# Patient Record
Sex: Male | Born: 1935 | Race: White | Hispanic: No | Marital: Married | Smoking: Former smoker
Health system: Southern US, Community
[De-identification: ages and names within clinical notes are randomized; demographics above are authoritative.]

## PROBLEM LIST (undated history)

## (undated) DIAGNOSIS — I10 Essential (primary) hypertension: Secondary | ICD-10-CM

## (undated) DIAGNOSIS — Z950 Presence of cardiac pacemaker: Secondary | ICD-10-CM

## (undated) DIAGNOSIS — E785 Hyperlipidemia, unspecified: Secondary | ICD-10-CM

## (undated) DIAGNOSIS — I2510 Atherosclerotic heart disease of native coronary artery without angina pectoris: Secondary | ICD-10-CM

## (undated) DIAGNOSIS — F0390 Unspecified dementia without behavioral disturbance: Secondary | ICD-10-CM

## (undated) DIAGNOSIS — I2109 ST elevation (STEMI) myocardial infarction involving other coronary artery of anterior wall: Secondary | ICD-10-CM

## (undated) HISTORY — PX: INSERT / REPLACE / REMOVE PACEMAKER: SUR710

## (undated) HISTORY — PX: CARDIAC CATHETERIZATION: SHX172

---

## 2016-05-20 ENCOUNTER — Emergency Department (HOSPITAL_COMMUNITY)
Admission: EM | Admit: 2016-05-20 | Discharge: 2016-05-20 | Disposition: A | Discharge status: Home / Self Care | Payer: Medicare Other | Source: Home / Self Care | Attending: Emergency Medicine | Admitting: Emergency Medicine

## 2016-05-20 ENCOUNTER — Emergency Department (HOSPITAL_COMMUNITY): Payer: Medicare Other

## 2016-05-20 DIAGNOSIS — W1830XA Fall on same level, unspecified, initial encounter: Secondary | ICD-10-CM

## 2016-05-20 DIAGNOSIS — Z950 Presence of cardiac pacemaker: Secondary | ICD-10-CM

## 2016-05-20 DIAGNOSIS — Y939 Activity, unspecified: Secondary | ICD-10-CM | POA: Insufficient documentation

## 2016-05-20 DIAGNOSIS — I2510 Atherosclerotic heart disease of native coronary artery without angina pectoris: Secondary | ICD-10-CM

## 2016-05-20 DIAGNOSIS — Y929 Unspecified place or not applicable: Secondary | ICD-10-CM | POA: Insufficient documentation

## 2016-05-20 DIAGNOSIS — R55 Syncope and collapse: Secondary | ICD-10-CM

## 2016-05-20 DIAGNOSIS — Y999 Unspecified external cause status: Secondary | ICD-10-CM

## 2016-05-20 LAB — CBC WITH DIFFERENTIAL/PLATELET
BASOS ABS: 0 10*3/uL (ref 0.0–0.1)
BASOS PCT: 0 %
EOS ABS: 0.2 10*3/uL (ref 0.0–0.7)
Eosinophils Relative: 3 %
HCT: 39.7 % (ref 39.0–52.0)
HEMOGLOBIN: 13.2 g/dL (ref 13.0–17.0)
Lymphocytes Relative: 15 %
Lymphs Abs: 1.2 10*3/uL (ref 0.7–4.0)
MCH: 29.7 pg (ref 26.0–34.0)
MCHC: 33.2 g/dL (ref 30.0–36.0)
MCV: 89.4 fL (ref 78.0–100.0)
Monocytes Absolute: 0.5 10*3/uL (ref 0.1–1.0)
Monocytes Relative: 6 %
NEUTROS ABS: 5.6 10*3/uL (ref 1.7–7.7)
NEUTROS PCT: 76 %
Platelets: 169 10*3/uL (ref 150–400)
RBC: 4.44 MIL/uL (ref 4.22–5.81)
RDW: 12.8 % (ref 11.5–15.5)
WBC: 7.5 10*3/uL (ref 4.0–10.5)

## 2016-05-20 LAB — COMPREHENSIVE METABOLIC PANEL
ALBUMIN: 3.4 g/dL — AB (ref 3.5–5.0)
ALK PHOS: 49 U/L (ref 38–126)
ALT: 15 U/L — AB (ref 17–63)
ANION GAP: 6 (ref 5–15)
AST: 21 U/L (ref 15–41)
BILIRUBIN TOTAL: 1.6 mg/dL — AB (ref 0.3–1.2)
BUN: 16 mg/dL (ref 6–20)
CALCIUM: 8.9 mg/dL (ref 8.9–10.3)
CO2: 26 mmol/L (ref 22–32)
CREATININE: 1.23 mg/dL (ref 0.61–1.24)
Chloride: 108 mmol/L (ref 101–111)
GFR calc non Af Amer: 51 mL/min — ABNORMAL LOW (ref >60)
GFR, EST AFRICAN AMERICAN: 59 mL/min — AB (ref >60)
GLUCOSE: 113 mg/dL — AB (ref 65–99)
Potassium: 4.5 mmol/L (ref 3.5–5.1)
SODIUM: 140 mmol/L (ref 135–145)
TOTAL PROTEIN: 6.2 g/dL — AB (ref 6.5–8.1)

## 2016-05-20 LAB — CBG MONITORING, ED: Glucose-Capillary: 120 mg/dL — ABNORMAL HIGH (ref 65–99)

## 2016-05-20 LAB — TROPONIN I: Troponin I: <0.03 ng/mL (ref <0.03)

## 2016-05-20 LAB — PROTIME-INR
INR: 1.11
Prothrombin Time: 14.4 seconds (ref 11.4–15.2)

## 2016-05-20 MED ORDER — SODIUM CHLORIDE 0.9 % IV BOLUS (SEPSIS)
1000.0000 mL | Freq: Once | INTRAVENOUS | Status: AC
  Administered 2016-05-20: 1000 mL via INTRAVENOUS

## 2016-05-20 MED ORDER — SODIUM CHLORIDE 0.9 % IV SOLN
INTRAVENOUS | Status: DC
Start: 2016-05-20 — End: 2016-05-20
  Administered 2016-05-20: 13:00:00 via INTRAVENOUS

## 2016-05-20 NOTE — Discharge Instructions (Signed)
As discussed, your evaluation today has been largely reassuring.  But, it is important that you monitor your condition carefully, and do not hesitate to return to the ED if you develop new, or concerning changes in your condition. ? ?Otherwise, please follow-up with your physician for appropriate ongoing care. ? ?

## 2016-05-20 NOTE — ED Provider Notes (Signed)
[facility]PT Provider Note   CSN: [id number] Arrival date & time: 05/20/16  1007     History   Chief Complaint Chief Complaint  Patient presents with  . Loss of Consciousness    HPI Louis Weiss is a 82 y.o. male.  HPI  Patient presents after an episode of syncope. Patient was in his usual state of health until just prior to ED arrival. This morning, after awakening in usual fashion, the patient ate breakfast, stood up, and while walking felt nauseous, lightheaded, subsequently passed out. He did fall backwards, but denies any pain, including head pain, neck pain, chest pain following the event. He also denies chest pain prior to the stent. Currently he has no complaints, including no lightheadedness, no nausea, no confusion, disorientation. Patient has multiple medical issues including known coronary disease, with ongoing medical management, with Plavix, pacemaker. No prior similar events.   No past medical history on file.  There are no active problems to display for this patient.   No past surgical history on file.     Home Medications    Prior to Admission medications   Not on File    Family History No family history on file.  Social History Social History  Substance Use Topics  . Smoking status: Not on file  . Smokeless tobacco: Not on file  . Alcohol use Not on file     Allergies   Review of patient's allergies indicates no known allergies.   Review of Systems Review of Systems  Constitutional:       Per HPI, otherwise negative  HENT:       Per HPI, otherwise negative  Respiratory:       Per HPI, otherwise negative  Cardiovascular:       Per HPI, otherwise negative  Gastrointestinal: Positive for nausea. Negative for vomiting.  Endocrine:       Negative aside from HPI  Genitourinary:       Neg aside from HPI   Musculoskeletal:       Per HPI, otherwise negative  Skin: Negative.   Neurological: Positive for syncope.      Physical Exam Updated Vital Signs BP 141/96   Pulse 70   Temp 98 F (36.7 C) (Oral)   Resp 15   SpO2 98%   Physical Exam  Constitutional: He is oriented to person, place, and time. He appears well-developed. No distress.  HENT:  Head: Normocephalic and atraumatic.  Eyes: Conjunctivae and EOM are normal.  Cardiovascular: Normal rate, regular rhythm and intact distal pulses.   Equal, symmetric pulses in UE  Pulmonary/Chest: Effort normal. No stridor. No respiratory distress.  Abdominal: He exhibits no distension. There is no tenderness.  Musculoskeletal: He exhibits no edema.  Neurological: He is alert and oriented to person, place, and time. No cranial nerve deficit. Coordination normal.  Skin: Skin is warm and dry.  Psychiatric: He has a normal mood and affect.  Nursing note and vitals reviewed.    ED Treatments / Results  Labs (all labs ordered are listed, but only abnormal results are displayed) Labs Reviewed  COMPREHENSIVE METABOLIC PANEL - Abnormal; Notable for the following:       Result Value   Glucose, Bld 113 (*)    Total Protein 6.2 (*)    Albumin 3.4 (*)    ALT 15 (*)    Total Bilirubin 1.6 (*)    GFR calc non Af Amer 51 (*)    GFR calc Af Amer 59 (*)  All other components within normal limits  CBG MONITORING, ED - Abnormal; Notable for the following:    Glucose-Capillary 120 (*)    All other components within normal limits  CBC WITH DIFFERENTIAL/PLATELET  TROPONIN I  PROTIME-INR  POCT CBG (FASTING - GLUCOSE)-MANUAL ENTRY    EMS rhythm strip with paced rhythm, rate 60, otherwise unremarkable  EKG  EKG Interpretation  Date/Time:  Thursday May 20 2016 10:34:13 EDT Ventricular Rate:  60 PR Interval:    QRS Duration: 96 QT Interval:  423 QTC Calculation: 423 R Axis:   62 Text Interpretation:  Sinus rhythm or paced - w minimally appreciable complexes Artifact T wave abnormality Abnormal ekg Confirmed by Gerhard Munch  MD ([id number]) on  05/20/2016 10:55:49 AM       Radiology Dg Chest 2 View  Result Date: 05/20/2016 CLINICAL DATA:  Syncope, status post fall EXAM: CHEST  2 VIEW COMPARISON:  None. FINDINGS: There is mild left basilar atelectasis versus scarring. There is no focal parenchymal opacity. There is no pleural effusion or pneumothorax. The heart and mediastinal contours are unremarkable. There is a dual lead AICD. The osseous structures are unremarkable. IMPRESSION: No active cardiopulmonary disease. Electronically Signed   By: Elige Ko   On: 05/20/2016 11:26    Procedures Procedures (including critical care time)  Medications Ordered in ED Medications  sodium chloride 0.9 % bolus 1,000 mL (0 mLs Intravenous Stopped 05/20/16 1252)    And  0.9 %  sodium chloride infusion ( Intravenous New Bag/Given 05/20/16 1252)     Initial Impression / Assessment and Plan / ED Course  I have reviewed the triage vital signs and the nursing notes.  Pertinent labs & imaging results that were available during my care of the patient were reviewed by me and considered in my medical decision making (see chart for details).  Clinical Course  Patient's pacemaker was interrogated, new recognized arrhythmia, good battery life.  3:35 PM Patient in no distress, sitting upright. We discussed all findings at length, including reassuring x-ray, labs.   Final Clinical Impressions(s) / ED Diagnoses  Patient presents with episode of syncope. Patient's description of a prodromal episode occurring after the patient spit up following breakfast, absence of chest pain before or afterwards, reassuring labs, vitals all are reassuring, with low suspicion for cardiogenic etiology. In addition, the patient had initiation of his pacemaker, which was not notable for new episodes of sustained arrhythmia. With reassuring findings, and no notable change in 5 hours of emergency department monitoring, the patient was appropriate for further evaluation,  management as an outpatient. Patient is on Plavix, with no chest pain, there is low suspicion for occult ACS. Patient did fall, but with no neurologic complaints, no change in 5 hours, there is no indication for head CT. Patient will follow up with primary care, cardiology at home.    Gerhard Munch, MD 05/20/16 1537

## 2016-05-20 NOTE — ED Notes (Addendum)
[employer] reports that pt is in NSR, all leads in appropriate place and no cardiac events have occurred today. EDP aware.

## 2016-05-20 NOTE — ED Notes (Signed)
Pt and family unsure of what type of pacemaker patient has. Family to contact another family member in order to find out.

## 2016-05-20 NOTE — ED Triage Notes (Signed)
[facility]- pt coming from after after he had a syncopal episode. Pt was walking up his steps and had LOC. Pt is alert and oriented at this time and has been throughout transport. Pt fell backwards and struck his head, abrasion noted, no hemorrhage. Pt does take blood thinners (plavix). 18g in LAV and vitals stable.

## 2016-05-20 NOTE — ED Notes (Signed)
[facility] [employer] [facility] due to no call back after attempting to interrogate pacemaker.

## 2016-05-20 NOTE — ED Notes (Signed)
Attempted to interrogate pacemaker, unsuccessful . [employer] contacted and rep to call back.

## 2016-05-20 NOTE — ED Notes (Signed)
Family has been called and aware pt will be d/c. Spoke to MD about CT scan of head d/t blood thinner usage. Pt is neurologically intact, no weakness, no signs of head injury. A&O X4 throughout visit.

## 2016-05-20 NOTE — ED Notes (Addendum)
cbg was 120

## 2016-05-22 ENCOUNTER — Encounter (HOSPITAL_COMMUNITY): Payer: Self-pay | Admitting: *Deleted

## 2016-05-22 ENCOUNTER — Inpatient Hospital Stay (HOSPITAL_COMMUNITY)
Admission: EM | Admit: 2016-05-22 | Discharge: 2016-05-23 | DRG: 392 | Disposition: A | Discharge status: Home / Self Care | Payer: Medicare Other | Attending: Cardiovascular Disease | Admitting: Cardiovascular Disease

## 2016-05-22 ENCOUNTER — Encounter (HOSPITAL_COMMUNITY)
Admission: EM | Disposition: A | Discharge status: Home / Self Care | Payer: Self-pay | Source: Home / Self Care | Attending: Cardiovascular Disease

## 2016-05-22 DIAGNOSIS — K219 Gastro-esophageal reflux disease without esophagitis: Principal | ICD-10-CM | POA: Diagnosis present

## 2016-05-22 DIAGNOSIS — I25110 Atherosclerotic heart disease of native coronary artery with unstable angina pectoris: Secondary | ICD-10-CM

## 2016-05-22 DIAGNOSIS — R079 Chest pain, unspecified: Secondary | ICD-10-CM

## 2016-05-22 DIAGNOSIS — I200 Unstable angina: Secondary | ICD-10-CM | POA: Diagnosis not present

## 2016-05-22 DIAGNOSIS — Z955 Presence of coronary angioplasty implant and graft: Secondary | ICD-10-CM | POA: Diagnosis not present

## 2016-05-22 DIAGNOSIS — E785 Hyperlipidemia, unspecified: Secondary | ICD-10-CM | POA: Diagnosis present

## 2016-05-22 DIAGNOSIS — Z7902 Long term (current) use of antithrombotics/antiplatelets: Secondary | ICD-10-CM | POA: Diagnosis not present

## 2016-05-22 DIAGNOSIS — F0390 Unspecified dementia without behavioral disturbance: Secondary | ICD-10-CM

## 2016-05-22 DIAGNOSIS — I10 Essential (primary) hypertension: Secondary | ICD-10-CM | POA: Diagnosis present

## 2016-05-22 DIAGNOSIS — I2510 Atherosclerotic heart disease of native coronary artery without angina pectoris: Secondary | ICD-10-CM | POA: Diagnosis present

## 2016-05-22 DIAGNOSIS — Z7982 Long term (current) use of aspirin: Secondary | ICD-10-CM

## 2016-05-22 DIAGNOSIS — Z950 Presence of cardiac pacemaker: Secondary | ICD-10-CM

## 2016-05-22 DIAGNOSIS — I25119 Atherosclerotic heart disease of native coronary artery with unspecified angina pectoris: Secondary | ICD-10-CM | POA: Diagnosis not present

## 2016-05-22 DIAGNOSIS — I2109 ST elevation (STEMI) myocardial infarction involving other coronary artery of anterior wall: Secondary | ICD-10-CM

## 2016-05-22 HISTORY — DX: Hyperlipidemia, unspecified: E78.5

## 2016-05-22 HISTORY — DX: Unspecified dementia, unspecified severity, without behavioral disturbance, psychotic disturbance, mood disturbance, and anxiety: F03.90

## 2016-05-22 HISTORY — DX: Presence of cardiac pacemaker: Z95.0

## 2016-05-22 HISTORY — PX: CARDIAC CATHETERIZATION: SHX172

## 2016-05-22 HISTORY — DX: ST elevation (STEMI) myocardial infarction involving other coronary artery of anterior wall: I21.09

## 2016-05-22 HISTORY — DX: Unspecified dementia without behavioral disturbance: F03.90

## 2016-05-22 HISTORY — DX: Atherosclerotic heart disease of native coronary artery without angina pectoris: I25.10

## 2016-05-22 HISTORY — DX: Essential (primary) hypertension: I10

## 2016-05-22 LAB — CBC
HCT: 41.1 % (ref 39.0–52.0)
HCT: 37.1 % — ABNORMAL LOW (ref 39.0–52.0)
Hemoglobin: 13.4 g/dL (ref 13.0–17.0)
Hemoglobin: 12.4 g/dL — ABNORMAL LOW (ref 13.0–17.0)
MCH: 29.6 pg (ref 26.0–34.0)
MCH: 29.8 pg (ref 26.0–34.0)
MCHC: 32.6 g/dL (ref 30.0–36.0)
MCHC: 33.4 g/dL (ref 30.0–36.0)
MCV: 90.7 fL (ref 78.0–100.0)
MCV: 89.2 fL (ref 78.0–100.0)
PLATELETS: 184 10*3/uL (ref 150–400)
PLATELETS: 165 10*3/uL (ref 150–400)
RBC: 4.53 MIL/uL (ref 4.22–5.81)
RBC: 4.16 MIL/uL — AB (ref 4.22–5.81)
RDW: 12.9 % (ref 11.5–15.5)
RDW: 13 % (ref 11.5–15.5)
WBC: 11.6 10*3/uL — AB (ref 4.0–10.5)
WBC: 9.5 10*3/uL (ref 4.0–10.5)

## 2016-05-22 LAB — BASIC METABOLIC PANEL
Anion gap: 8 (ref 5–15)
BUN: 19 mg/dL (ref 6–20)
CHLORIDE: 109 mmol/L (ref 101–111)
CO2: 22 mmol/L (ref 22–32)
CREATININE: 1.12 mg/dL (ref 0.61–1.24)
Calcium: 8.9 mg/dL (ref 8.9–10.3)
GFR calc Af Amer: >60 mL/min (ref >60)
GFR calc non Af Amer: 57 mL/min — ABNORMAL LOW (ref >60)
GLUCOSE: 92 mg/dL (ref 65–99)
Potassium: 4.1 mmol/L (ref 3.5–5.1)
SODIUM: 139 mmol/L (ref 135–145)

## 2016-05-22 LAB — TROPONIN I: Troponin I: <0.03 ng/mL (ref <0.03)

## 2016-05-22 LAB — CREATININE, SERUM
CREATININE: 1.16 mg/dL (ref 0.61–1.24)
GFR calc Af Amer: >60 mL/min (ref >60)
GFR calc non Af Amer: 55 mL/min — ABNORMAL LOW (ref >60)

## 2016-05-22 LAB — I-STAT TROPONIN, ED: Troponin i, poc: 0.01 ng/mL (ref 0.00–0.08)

## 2016-05-22 LAB — MRSA PCR SCREENING: MRSA by PCR: NEGATIVE

## 2016-05-22 SURGERY — LEFT HEART CATH AND CORONARY ANGIOGRAPHY
Anesthesia: LOCAL

## 2016-05-22 MED ORDER — IOPAMIDOL (ISOVUE-370) INJECTION 76%
INTRAVENOUS | Status: AC
  Filled 2016-05-22: qty 125

## 2016-05-22 MED ORDER — PRAVASTATIN SODIUM 40 MG PO TABS: 80.0000 mg | ORAL_TABLET | Freq: Every day | ORAL | Status: DC

## 2016-05-22 MED ORDER — LIDOCAINE HCL (PF) 1 % IJ SOLN
INTRAMUSCULAR | Status: AC
  Filled 2016-05-22: qty 30

## 2016-05-22 MED ORDER — OMEGA-3-ACID ETHYL ESTERS 1 G PO CAPS
1.0000 g | ORAL_CAPSULE | Freq: Every day | ORAL | Status: DC
  Administered 2016-05-23: 1 g via ORAL
  Filled 2016-05-22: qty 1

## 2016-05-22 MED ORDER — ACETAMINOPHEN 325 MG PO TABS: 650.0000 mg | ORAL_TABLET | ORAL | Status: DC | PRN

## 2016-05-22 MED ORDER — FUROSEMIDE 20 MG PO TABS
20.0000 mg | ORAL_TABLET | Freq: Every day | ORAL | Status: DC
  Administered 2016-05-23: 20 mg via ORAL
  Filled 2016-05-22: qty 1

## 2016-05-22 MED ORDER — SODIUM CHLORIDE 0.9 % IV SOLN
INTRAVENOUS | Status: DC | PRN
  Administered 2016-05-22: 10 mL/h via INTRAVENOUS

## 2016-05-22 MED ORDER — VERAPAMIL HCL 2.5 MG/ML IV SOLN
INTRAVENOUS | Status: DC | PRN
  Administered 2016-05-22: 10 mL via INTRA_ARTERIAL

## 2016-05-22 MED ORDER — SODIUM CHLORIDE 0.9% FLUSH
3.0000 mL | Freq: Two times a day (BID) | INTRAVENOUS | Status: DC
  Administered 2016-05-22 – 2016-05-23 (×2): 3 mL via INTRAVENOUS

## 2016-05-22 MED ORDER — FISH OIL 1000 MG PO CAPS: 1.0000 | ORAL_CAPSULE | Freq: Every day | ORAL | Status: DC

## 2016-05-22 MED ORDER — NITROGLYCERIN 0.4 MG SL SUBL: 0.4000 mg | SUBLINGUAL_TABLET | SUBLINGUAL | Status: DC | PRN

## 2016-05-22 MED ORDER — SODIUM CHLORIDE 0.9 % IV SOLN
INTRAVENOUS | Status: DC | PRN
  Administered 2016-05-22: 75 mL/h via INTRAVENOUS

## 2016-05-22 MED ORDER — CLOPIDOGREL BISULFATE 300 MG PO TABS
ORAL_TABLET | ORAL | Status: DC | PRN
  Administered 2016-05-22: 300 mg via ORAL

## 2016-05-22 MED ORDER — VERAPAMIL HCL 2.5 MG/ML IV SOLN
INTRAVENOUS | Status: AC
  Filled 2016-05-22: qty 2

## 2016-05-22 MED ORDER — METOPROLOL TARTRATE 25 MG PO TABS
25.0000 mg | ORAL_TABLET | Freq: Two times a day (BID) | ORAL | Status: DC
  Administered 2016-05-22 – 2016-05-23 (×2): 25 mg via ORAL
  Filled 2016-05-22 (×2): qty 1

## 2016-05-22 MED ORDER — HEPARIN (PORCINE) IN NACL 2-0.9 UNIT/ML-% IJ SOLN
INTRAMUSCULAR | Status: AC
  Filled 2016-05-22: qty 1000

## 2016-05-22 MED ORDER — ROPINIROLE HCL 1 MG PO TABS
1.0000 mg | ORAL_TABLET | Freq: Two times a day (BID) | ORAL | Status: DC
  Administered 2016-05-22 – 2016-05-23 (×2): 1 mg via ORAL
  Filled 2016-05-22 (×2): qty 1

## 2016-05-22 MED ORDER — MIDAZOLAM HCL 2 MG/2ML IJ SOLN
INTRAMUSCULAR | Status: DC | PRN
  Administered 2016-05-22: 1 mg via INTRAVENOUS

## 2016-05-22 MED ORDER — DONEPEZIL HCL 5 MG PO TABS
5.0000 mg | ORAL_TABLET | Freq: Every day | ORAL | Status: DC
Start: 2016-05-23 — End: 2016-05-23
  Administered 2016-05-23: 5 mg via ORAL
  Filled 2016-05-22: qty 1

## 2016-05-22 MED ORDER — ASPIRIN 81 MG PO TABS: 81.0000 mg | ORAL_TABLET | Freq: Every day | ORAL | Status: DC

## 2016-05-22 MED ORDER — POTASSIUM CHLORIDE CRYS ER 20 MEQ PO TBCR
20.0000 meq | EXTENDED_RELEASE_TABLET | Freq: Every day | ORAL | Status: DC
  Administered 2016-05-23: 20 meq via ORAL
  Filled 2016-05-22: qty 1

## 2016-05-22 MED ORDER — ASPIRIN 81 MG PO CHEW
81.0000 mg | CHEWABLE_TABLET | Freq: Every day | ORAL | Status: DC
  Administered 2016-05-23: 81 mg via ORAL
  Filled 2016-05-22: qty 1

## 2016-05-22 MED ORDER — MIDAZOLAM HCL 2 MG/2ML IJ SOLN
INTRAMUSCULAR | Status: AC
  Filled 2016-05-22: qty 2

## 2016-05-22 MED ORDER — ONDANSETRON HCL 4 MG/2ML IJ SOLN: 4.0000 mg | Freq: Four times a day (QID) | INTRAMUSCULAR | Status: DC | PRN

## 2016-05-22 MED ORDER — SODIUM CHLORIDE 0.9% FLUSH: 3.0000 mL | INTRAVENOUS | Status: DC | PRN

## 2016-05-22 MED ORDER — LIDOCAINE HCL (PF) 1 % IJ SOLN
INTRAMUSCULAR | Status: DC | PRN
  Administered 2016-05-22: 4 mL

## 2016-05-22 MED ORDER — HEPARIN (PORCINE) IN NACL 2-0.9 UNIT/ML-% IJ SOLN
INTRAMUSCULAR | Status: DC | PRN
  Administered 2016-05-22: 1000 mL

## 2016-05-22 MED ORDER — SODIUM CHLORIDE 0.9 % IV SOLN: INTRAVENOUS | Status: AC

## 2016-05-22 MED ORDER — CLOPIDOGREL BISULFATE 300 MG PO TABS
ORAL_TABLET | ORAL | Status: AC
  Filled 2016-05-22: qty 1

## 2016-05-22 MED ORDER — ASPIRIN 81 MG PO CHEW
324.0000 mg | CHEWABLE_TABLET | Freq: Once | ORAL | Status: AC
  Administered 2016-05-22: 324 mg via ORAL
  Filled 2016-05-22: qty 4

## 2016-05-22 MED ORDER — BIVALIRUDIN 250 MG IV SOLR
INTRAVENOUS | Status: AC
  Filled 2016-05-22: qty 250

## 2016-05-22 MED ORDER — AMLODIPINE BESYLATE 10 MG PO TABS
10.0000 mg | ORAL_TABLET | Freq: Every day | ORAL | Status: DC
  Administered 2016-05-23: 10 mg via ORAL
  Filled 2016-05-22: qty 1

## 2016-05-22 MED ORDER — SODIUM CHLORIDE 0.9 % IV SOLN: 250.0000 mL | INTRAVENOUS | Status: DC | PRN

## 2016-05-22 MED ORDER — IOPAMIDOL (ISOVUE-370) INJECTION 76%
INTRAVENOUS | Status: DC | PRN
  Administered 2016-05-22: 75 mL via INTRA_ARTERIAL

## 2016-05-22 MED ORDER — HEPARIN SODIUM (PORCINE) 5000 UNIT/ML IJ SOLN
INTRAMUSCULAR | Status: AC
  Administered 2016-05-22: 4000 [IU]
  Filled 2016-05-22: qty 1

## 2016-05-22 MED ORDER — ISOSORBIDE MONONITRATE 20 MG PO TABS
60.0000 mg | ORAL_TABLET | Freq: Two times a day (BID) | ORAL | Status: DC
  Administered 2016-05-22 – 2016-05-23 (×2): 60 mg via ORAL
  Filled 2016-05-22 (×3): qty 3

## 2016-05-22 MED ORDER — HEPARIN SODIUM (PORCINE) 5000 UNIT/ML IJ SOLN
5000.0000 [IU] | Freq: Three times a day (TID) | INTRAMUSCULAR | Status: DC
  Administered 2016-05-23: 5000 [IU] via SUBCUTANEOUS
  Filled 2016-05-22 (×2): qty 1

## 2016-05-22 MED ORDER — CLOPIDOGREL BISULFATE 75 MG PO TABS
75.0000 mg | ORAL_TABLET | Freq: Every day | ORAL | Status: DC
  Administered 2016-05-23: 75 mg via ORAL
  Filled 2016-05-22: qty 1

## 2016-05-22 SURGICAL SUPPLY — 12 items
CATH INFINITI 5 FR JL3.5 (CATHETERS) ×2 IMPLANT
CATH INFINITI 5 FR STR PIGTAIL (CATHETERS) ×2 IMPLANT
CATH INFINITI JR4 5F (CATHETERS) ×2 IMPLANT
DEVICE RAD COMP TR BAND LRG (VASCULAR PRODUCTS) ×2 IMPLANT
GLIDESHEATH SLEND SS 6F .021 (SHEATH) ×2 IMPLANT
KIT ENCORE 26 ADVANTAGE (KITS) ×2 IMPLANT
KIT HEART LEFT (KITS) ×2 IMPLANT
PACK CARDIAC CATHETERIZATION (CUSTOM PROCEDURE TRAY) ×2 IMPLANT
SYR MEDRAD MARK V 150ML (SYRINGE) ×2 IMPLANT
TRANSDUCER W/STOPCOCK (MISCELLANEOUS) ×2 IMPLANT
TUBING CIL FLEX 10 FLL-RA (TUBING) ×2 IMPLANT
WIRE SAFE-T 1.5MM-J .035X260CM (WIRE) ×2 IMPLANT

## 2016-05-22 NOTE — H&P (Addendum)
History and Physical  Patient ID: Louis Weiss MRN: [id number], SOB: 05/22/1927 82 y.o. Date of Encounter: 05/22/2016, 3:00 PM  Primary Physician: PROVIDER NOT IN SYSTEM Primary Cardiologist: none  Chief Complaint: Chest pain  HPI: 82 y.o. male w/ PMHx significant for CAD/prior PCI who [facility]2016 with complaints of chest pain.  [place], [place]. He is [place] in [place] visiting his daughter and her family. He woke up at 4 AM today with substernal chest discomfort that he describes a pressure-like feeling in his chest. There has been associated belching. He rates the pain at 1/10 in intensity and it continues as I am conducting his interview. However, his daughter thinks that it has been much more significant than that. The patient was unable to walk around this morning at all because he was hurting in his chest. He denies shortness of breath, edema, or heart palpitations. He does complain of belching and feels like he has had heartburn.  He has a history of permanent pacemaker placement originally 10 years ago. It sounds like he had a generator change more recently than that. He also has had coronary stenting on multiple occasions, most recently about 6 months ago. He was told there is a blockage around the "back of the heart" that the doctor could not open.  He was evaluated here in the emergency room just 48 hours ago with an episode of syncope. At that time he had no complaints of chest pain. His pacemaker was interrogated and there were no abnormalities identified.    Past Medical History:  Diagnosis Date  . CAD (coronary artery disease), native coronary artery 05/22/2016   History of PCI  . Coronary artery disease   . Dementia 05/22/2016  . Hyperlipidemia   . Hypertension   . Pacemaker   . Presence of permanent cardiac pacemaker 05/22/2016  . ST elevation myocardial infarction (STEMI) of anterior wall (HCC) 05/22/2016       Surgical History:  Past Surgical History:  Procedure Laterality Date  . CARDIAC CATHETERIZATION    . INSERT / REPLACE / REMOVE PACEMAKER       Home Meds: Prior to Admission medications   Medication Sig Start Date End Date Taking? Authorizing Provider  amLODipine (NORVASC) 10 MG tablet Take 10 mg by mouth daily.   Yes Historical Provider, MD  aspirin 81 MG tablet Take 81 mg by mouth daily.   Yes Historical Provider, MD  clopidogrel (PLAVIX) 75 MG tablet Take 75 mg by mouth daily.   Yes Historical Provider, MD  donepezil (ARICEPT) 10 MG tablet Take 5 mg by mouth daily.   Yes Historical Provider, MD  furosemide (LASIX) 20 MG tablet Take 20 mg by mouth daily.   Yes Historical Provider, MD  isosorbide mononitrate (ISMO,MONOKET) 20 MG tablet Take 60 mg by mouth 2 (two) times daily.    Yes Historical Provider, MD  Menthol, Topical Analgesic, (BIOFREEZE) 4 % GEL Apply 1 application topically 4 (four) times daily as needed.   Yes Historical Provider, MD  metoprolol (LOPRESSOR) 50 MG tablet Take 25 mg by mouth 2 (two) times daily.   Yes Historical Provider, MD  nitroGLYCERIN (NITROSTAT) 0.4 MG SL tablet Place 0.4 mg under the tongue every 5 (five) minutes as needed for chest pain.   Yes Historical Provider, MD  Omega-3 Fatty Acids (FISH OIL) 1000 MG CAPS Take 1 capsule by mouth daily.   Yes Historical Provider, MD  potassium chloride  SA (K-DUR,KLOR-CON) 20 MEQ tablet Take 20 mEq by mouth daily.   Yes Historical Provider, MD  pravastatin (PRAVACHOL) 80 MG tablet Take 80 mg by mouth at bedtime.    Yes Historical Provider, MD  rOPINIRole (REQUIP) 1 MG tablet Take 1 mg by mouth 2 (two) times daily.   Yes Historical Provider, MD    Allergies:  Allergies  Allergen Reactions  . Influenza Vaccines Other (See Comments)    Pt is unsure of this med    Social History   Social History  . Marital status: Married    Spouse name: N/A  . Number of children: N/A  . Years of education: N/A    Occupational History  . Not on file.   Social History Main Topics  . Smoking status: Not on file  . Smokeless tobacco: Not on file  . Alcohol use Not on file  . Drug use: Unknown  . Sexual activity: Not on file   Other Topics Concern  . Not on file   Social History Narrative  . No narrative on file     Family history: Negative for premature CAD  Review of Systems: General: negative for chills, fever, night sweats or weight changes.  ENT: negative for rhinorrhea or epistaxis Cardiovascular: See history of present illness Dermatological: negative for rash Respiratory: negative for cough or wheezing GI: negative for nausea, vomiting, diarrhea, bright red blood per rectum, melena, or hematemesis. Positive for heartburn and belching GU: no hematuria, urgency, or frequency Neurologic: negative for visual changes, headache, or dizziness. Positive for syncope Heme: no easy bruising or bleeding Endo: negative for excessive thirst, thyroid disorder, or flushing Musculoskeletal: negative for joint pain or swelling, negative for myalgias All other systems reviewed and are otherwise negative except as noted above.  Physical Exam: Blood pressure 133/67, pulse 66, resp. rate 19, height 5\' 9"  (1.753 m), weight 181 lb (82.1 kg), SpO2 100 %. General: Well developed, well nourished, alert and oriented, in no acute distress. Pleasant elderly male. Somewhat poor historian and much of his history is obtained from his daughter. HEENT: Normocephalic, atraumatic, sclera anicteric Neck: Supple. Carotids 2+ without bruits. JVP normal Lungs: Clear bilaterally to auscultation without wheezes, rales, or rhonchi. Breathing is unlabored. Heart: RRR with normal S1 and S2. No murmurs, rubs, or gallops appreciated. Abdomen: Soft, non-tender, non-distended with normoactive bowel sounds. No hepatomegaly. No rebound/guarding. No obvious abdominal masses. Back: No CVA tenderness Msk:  Strength and tone appear  normal for age. Extremities: No clubbing, cyanosis, or edema.  Distal pedal pulses are 2+ and equal bilaterally. Neuro: CNII-XII intact, moves all extremities spontaneously. Psych:  Responds to questions appropriately with a normal affect. Skin: warm and dry without rash   Labs:   Lab Results  Component Value Date   WBC 11.6 (H) 05/22/2016   HGB 13.4 05/22/2016   HCT 41.1 05/22/2016   MCV 90.7 05/22/2016   PLT 184 05/22/2016     Recent Labs Lab 05/20/16 1029 05/22/16 1326  NA 140 139  K 4.5 4.1  CL 108 109  CO2 26 22  BUN 16 19  CREATININE 1.23 1.12  CALCIUM 8.9 8.9  PROT 6.2*  --   BILITOT 1.6*  --   ALKPHOS 49  --   ALT 15*  --   AST 21  --   GLUCOSE 113* 92    Recent Labs  05/20/16 1029  TROPONINI <0.03   No results found for: CHOL, HDL, LDLCALC, TRIG No results found for: DDIMER  Radiology/Studies:  Dg Chest 2 View  Result Date: 05/20/2016 CLINICAL DATA:  Syncope, status post fall EXAM: CHEST  2 VIEW COMPARISON:  None. FINDINGS: There is mild left basilar atelectasis versus scarring. There is no focal parenchymal opacity. There is no pleural effusion or pneumothorax. The heart and mediastinal contours are unremarkable. There is a dual lead AICD. The osseous structures are unremarkable. IMPRESSION: No active cardiopulmonary disease. Electronically Signed   By: Elige Ko   On: 05/20/2016 11:26     EKG: Normal sinus rhythm. ST and T wave abnormality suspicious for anteroseptal/lateral STEMI. Changes are new from prior EKG 05/20/2016.  CARDIAC STUDIES: Pending  ASSESSMENT AND PLAN:  1. Acute coronary syndrome with dynamic ST segment change, possible anterior/lateral STEMI. Patient with mild ongoing chest discomfort and dynamic EKG changes. Recommend emergency cardiac catheterization and possible PCI. I reviewed risks, indications, and alternatives with both the patient and his daughter. They understand and agree to proceed. Emergency implied consent is  obtained.  The patient was given aspirin 324 mg and heparin 4000 units in the emergency department. Further management pending his cardiac catheterization results.  2. Coronary artery disease, native vessel: The patient has been on dual antiplatelet therapy and has been compliant with aspirin and Plavix per his daughter. We'll give an additional Plavix 300 mg prior to his cardiac catheterization procedure. Secondary risk reduction measures will be continued. His medical regimen is reviewed and he is on a good antianginal/risk reduction program.  3. Essential hypertension: continue antihypertensive Rx  4. Hyperlipidemia: check lipids, continue pravastatin. Consider high-intensity statin but may not have tolerated this in past.  Dispo: pending cath result  Signed, Tonny Bollman MD 05/22/2016, 3:00 PM

## 2016-05-22 NOTE — ED Provider Notes (Signed)
[facility] Provider Note   CSN: [id number] Arrival date & time: 05/22/16  1305     History   Chief Complaint Chief Complaint  Patient presents with  . Chest Pain    HPI Louis Weiss is a 82 y.o. male.  HPI   Patient is an 82 year old male presenting with chest pain. Patient had a small amount of chest pain this morning that awoke him from sleep at 4 AM. Patient's daughter committed to the urgent care. Urgent care sent here for EKG changes. Patient was recently here couple days ago for syncope. Patient's EKG shows significant differences between then and now. Patient had no associated diaphoresis, didn't pain did not radiate.   Past Medical History:  Diagnosis Date  . CAD (coronary artery disease), native coronary artery 05/22/2016   History of PCI  . Coronary artery disease   . Dementia 05/22/2016  . Hyperlipidemia   . Hypertension   . Pacemaker   . Presence of permanent cardiac pacemaker 05/22/2016  . ST elevation myocardial infarction (STEMI) of anterior wall (HCC) 05/22/2016    Patient Active Problem List   Diagnosis Date Noted  . CAD (coronary artery disease), native coronary artery 05/22/2016  . Essential hypertension 05/22/2016  . Dementia 05/22/2016  . Presence of permanent cardiac pacemaker 05/22/2016  . Unstable angina pectoris [facility])     Past Surgical History:  Procedure Laterality Date  . CARDIAC CATHETERIZATION    . INSERT / REPLACE / REMOVE PACEMAKER         Home Medications    Prior to Admission medications   Medication Sig Start Date End Date Taking? Authorizing Provider  amLODipine (NORVASC) 10 MG tablet Take 10 mg by mouth daily.   Yes Historical Provider, MD  aspirin 81 MG tablet Take 81 mg by mouth daily.   Yes Historical Provider, MD  clopidogrel (PLAVIX) 75 MG tablet Take 75 mg by mouth daily.   Yes Historical Provider, MD  donepezil (ARICEPT) 10 MG tablet Take 5 mg by mouth daily.   Yes Historical Provider, MD  furosemide (LASIX)  20 MG tablet Take 20 mg by mouth daily.   Yes Historical Provider, MD  isosorbide mononitrate (ISMO,MONOKET) 20 MG tablet Take 60 mg by mouth 2 (two) times daily.    Yes Historical Provider, MD  Menthol, Topical Analgesic, (BIOFREEZE) 4 % GEL Apply 1 application topically 4 (four) times daily as needed.   Yes Historical Provider, MD  metoprolol (LOPRESSOR) 50 MG tablet Take 25 mg by mouth 2 (two) times daily.   Yes Historical Provider, MD  nitroGLYCERIN (NITROSTAT) 0.4 MG SL tablet Place 0.4 mg under the tongue every 5 (five) minutes as needed for chest pain.   Yes Historical Provider, MD  Omega-3 Fatty Acids (FISH OIL) 1000 MG CAPS Take 1 capsule by mouth daily.   Yes Historical Provider, MD  potassium chloride SA (K-DUR,KLOR-CON) 20 MEQ tablet Take 20 mEq by mouth daily.   Yes Historical Provider, MD  pravastatin (PRAVACHOL) 80 MG tablet Take 80 mg by mouth at bedtime.    Yes Historical Provider, MD  rOPINIRole (REQUIP) 1 MG tablet Take 1 mg by mouth 2 (two) times daily.   Yes Historical Provider, MD    Family History History reviewed. No pertinent family history.  Social History Social History  Substance Use Topics  . Smoking status: Not on file  . Smokeless tobacco: Not on file  . Alcohol use Not on file     Allergies   Influenza vaccines  Review of Systems Review of Systems  Constitutional: Negative for fatigue and fever.  Respiratory: Positive for chest tightness.   Cardiovascular: Positive for chest pain.  Gastrointestinal: Negative for abdominal pain.  Genitourinary: Negative for dysuria.  Psychiatric/Behavioral: Negative for agitation.  All other systems reviewed and are negative.    Physical Exam Updated Vital Signs BP 133/69   Pulse 62   Resp 16   Ht 5\' 9"  (1.753 m)   Wt 181 lb (82.1 kg)   SpO2 100%   BMI 26.73 kg/m   Physical Exam  Constitutional: He appears well-developed and well-nourished.  HENT:  Head: Normocephalic and atraumatic.  Eyes:  Conjunctivae are normal.  Neck: Neck supple.  Cardiovascular: Normal rate and regular rhythm.   No murmur heard. Pulmonary/Chest: Effort normal and breath sounds normal. No respiratory distress.  Abdominal: Soft. There is no tenderness.  Musculoskeletal: He exhibits no edema.  Neurological: He is alert. No cranial nerve deficit.  Skin: Skin is warm and dry.  Psychiatric: He has a normal mood and affect.  Nursing note and vitals reviewed.    ED Treatments / Results  Labs (all labs ordered are listed, but only abnormal results are displayed) Labs Reviewed  BASIC METABOLIC PANEL - Abnormal; Notable for the following:       Result Value   GFR calc non Af Amer 57 (*)    All other components within normal limits  CBC - Abnormal; Notable for the following:    WBC 11.6 (*)    All other components within normal limits  I-STAT TROPOININ, ED    EKG  EKG Interpretation  Date/Time:  Saturday May 22 2016 13:11:20 EDT Ventricular Rate:  65 PR Interval:  154 QRS Duration: 96 QT Interval:  402 QTC Calculation: 418 R Axis:   21 Text Interpretation:  Normal sinus rhythm Minimal voltage criteria for LVH, may be normal variant ST elevation, consider early repolarization, pericarditis, or injury Abnormal ECG ST elevation in 1 2 and avL different than two days ago.  Confirmed by Kandis Mannan ([id number]) on 05/22/2016 3:32:50 PM       Radiology No results found.  Procedures Procedures (including critical care time)  Medications Ordered in ED Medications  clopidogrel (PLAVIX) tablet (300 mg Oral Given 05/22/16 1411)  midazolam (VERSED) injection (1 mg Intravenous Given 05/22/16 1419)  lidocaine (PF) (XYLOCAINE) 1 % injection (4 mLs Infiltration Given 05/22/16 1423)  Radial Cocktail/Verapamil only (10 mLs Intra-arterial Given 05/22/16 1425)  iopamidol (ISOVUE-370) 76 % injection (75 mLs Intra-arterial Given 05/22/16 1439)  heparin infusion 2 units/mL in 0.9 % sodium chloride (1,000 mLs  Other New Bag/Given 05/22/16 1439)  0.9 %  sodium chloride infusion (75 mL/hr Intravenous New Bag/Given 05/22/16 1414)  0.9 %  sodium chloride infusion ( Intravenous Stopped 05/22/16 1448)  aspirin chewable tablet 324 mg (324 mg Oral Given 05/22/16 1352)  heparin 5000 UNIT/ML injection (4,000 Units  Given 05/22/16 1403)     Initial Impression / Assessment and Plan / ED Course  I have reviewed the triage vital signs and the nursing notes.  Pertinent labs & imaging results that were available during my care of the patient were reviewed by me and considered in my medical decision making (see chart for details).  Clinical Course   Patient is a 82 year old male presenting with chest pain. EKG brought to me from triage and/or significant ST elevations. This compared to 2 days ago the EKG was completely different. Called Dr. Excell Seltzer he agrees and weak called comes  code STEMI. Patient given aspirin, brought to Cath Lab.  CRITICAL CARE Performed by: Arlana Hoveourteney L MacKuen Total critical care time: 30 minutes Critical care time was exclusive of separately billable procedures and treating other patients. Critical care was necessary to treat or prevent imminent or life-threatening deterioration. Critical care was time spent personally by me on the following activities: development of treatment plan with patient and/or surrogate as well as nursing, discussions with consultants, evaluation of patient's response to treatment, examination of patient, obtaining history from patient or surrogate, ordering and performing treatments and interventions, ordering and review of laboratory studies, ordering and review of radiographic studies, pulse oximetry and re-evaluation of patient's condition.   Final Clinical Impressions(s) / ED Diagnoses   Final diagnoses:  None    New Prescriptions Current Discharge Medication List       Courteney Randall AnLyn Mackuen, MD 05/22/16 1533

## 2016-05-22 NOTE — ED Notes (Signed)
Dr. Cooper at the bedside.  

## 2016-05-22 NOTE — ED Notes (Signed)
Spoke with Dr. Corlis LeakMackuen and she wanted to give ASA 324mg  , not to give Heparin until Cardiologist arrive

## 2016-05-22 NOTE — Progress Notes (Signed)
   05/22/16 1338  Clinical Encounter Type  Visited With Patient and family together  Visit Type ED  Referral From Other (Comment) (Stemi-Trauma B)  Spiritual Encounters  Spiritual Needs Prayer;Emotional  Stress Factors  Patient Stress Factors None identified  Family Stress Factors Exhausted  Pt's daughter & grandchildren present. Offered up prayer of recovery.

## 2016-05-22 NOTE — ED Triage Notes (Signed)
Pt reports waking up today with mild chest discomfort. Denies sob. Has cardiac hx. ekg done and no acute distress noted at triage.

## 2016-05-23 DIAGNOSIS — I25119 Atherosclerotic heart disease of native coronary artery with unspecified angina pectoris: Secondary | ICD-10-CM

## 2016-05-23 DIAGNOSIS — K219 Gastro-esophageal reflux disease without esophagitis: Principal | ICD-10-CM

## 2016-05-23 LAB — TROPONIN I: Troponin I: <0.03 ng/mL (ref <0.03)

## 2016-05-23 LAB — BASIC METABOLIC PANEL
Anion gap: 6 (ref 5–15)
BUN: 17 mg/dL (ref 6–20)
CHLORIDE: 107 mmol/L (ref 101–111)
CO2: 24 mmol/L (ref 22–32)
CREATININE: 1.23 mg/dL (ref 0.61–1.24)
Calcium: 8.1 mg/dL — ABNORMAL LOW (ref 8.9–10.3)
GFR calc non Af Amer: 51 mL/min — ABNORMAL LOW (ref >60)
GFR, EST AFRICAN AMERICAN: 59 mL/min — AB (ref >60)
Glucose, Bld: 109 mg/dL — ABNORMAL HIGH (ref 65–99)
POTASSIUM: 3.9 mmol/L (ref 3.5–5.1)
Sodium: 137 mmol/L (ref 135–145)

## 2016-05-23 LAB — CBC
HCT: 34.5 % — ABNORMAL LOW (ref 39.0–52.0)
HEMOGLOBIN: 11.2 g/dL — AB (ref 13.0–17.0)
MCH: 29.4 pg (ref 26.0–34.0)
MCHC: 32.5 g/dL (ref 30.0–36.0)
MCV: 90.6 fL (ref 78.0–100.0)
PLATELETS: 160 10*3/uL (ref 150–400)
RBC: 3.81 MIL/uL — ABNORMAL LOW (ref 4.22–5.81)
RDW: 13.2 % (ref 11.5–15.5)
WBC: 8.6 10*3/uL (ref 4.0–10.5)

## 2016-05-23 LAB — LIPID PANEL
CHOL/HDL RATIO: 2.7 ratio
Cholesterol: 93 mg/dL (ref 0–200)
HDL: 35 mg/dL — AB (ref >40)
LDL CALC: 40 mg/dL (ref 0–99)
TRIGLYCERIDES: 91 mg/dL (ref <150)
VLDL: 18 mg/dL (ref 0–40)

## 2016-05-23 MED ORDER — PANTOPRAZOLE SODIUM 20 MG PO TBEC: 20.0000 mg | DELAYED_RELEASE_TABLET | Freq: Every day | ORAL | Status: DC

## 2016-05-23 MED ORDER — PANTOPRAZOLE SODIUM 20 MG PO TBEC: 20.0000 mg | DELAYED_RELEASE_TABLET | Freq: Every day | ORAL | 6 refills | Status: AC

## 2016-05-23 NOTE — Progress Notes (Addendum)
Subjective:   82 y/o male from [place] (here visiting) with h/o CAD with multiple PCIs, HTN, HL, dementia and h/o PPM. Admitted 9/23 with Cp after lying down. ECG suggestive of possible STEMI   Cath with  1. Moderate diffuse mid RCA stenosis 2. Continued patency of the stented segments in the proximal RCA and proximal circumflex  3. Severe distal LAD stenosis involving the apical portion of the LAD 4. Vigorous LV systolic function  Treated medically. Troponins remaon normal  Feels fine, No further CP. No SOB. Wants to go home     Intake/Output Summary (Last 24 hours) at 05/23/16 1241 Last data filed at 05/23/16 1000  Gross per 24 hour  Intake           703.75 ml  Output             1026 ml  Net          -322.25 ml    Current meds: . amLODipine  10 mg Oral Daily  . aspirin  81 mg Oral Daily  . clopidogrel  75 mg Oral Daily  . donepezil  5 mg Oral Daily  . furosemide  20 mg Oral Daily  . heparin  5,000 Units Subcutaneous Q8H  . isosorbide mononitrate  60 mg Oral BID  . metoprolol  25 mg Oral BID  . omega-3 acid ethyl esters  1 g Oral Daily  . potassium chloride SA  20 mEq Oral Daily  . pravastatin  80 mg Oral QHS  . rOPINIRole  1 mg Oral BID  . sodium chloride flush  3 mL Intravenous Q12H   Infusions:     Objective:  Blood pressure (!) 142/67, pulse 72, temperature 97.6 F (36.4 C), temperature source Oral, resp. rate 13, height 5\' 10"  (1.778 m), weight 81.2 kg (179 lb 0.2 oz), SpO2 97 %. Weight change:   Physical Exam: General:  Elderly  Sitting in chair HEENT: normal Neck: supple. JVP 7-8 . Carotids 2+ bilat; no bruits. No lymphadenopathy or thryomegaly appreciated. Cor: PMI nondisplaced. Regular rate & rhythm. No rubs, gallops or murmurs. Lungs: clear Abdomen: soft, nontender, nondistended. No hepatosplenomegaly. No bruits or masses. Good bowel sounds. Extremities: no cyanosis, clubbing, rash, edema Neuro: alert & orientedx3, cranial nerves grossly intact.  moves all 4 extremities w/o difficulty. Affect pleasant  Telemetry: NSR   Lab Results: Basic Metabolic Panel:  Recent Labs Lab 05/20/16 1029 05/22/16 1326 05/22/16 1653 05/23/16 0232  NA 140 139  --  137  K 4.5 4.1  --  3.9  CL 108 109  --  107  CO2 26 22  --  24  GLUCOSE 113* 92  --  109*  BUN 16 19  --  17  CREATININE 1.23 1.12 1.16 1.23  CALCIUM 8.9 8.9  --  8.1*   Liver Function Tests:  Recent Labs Lab 05/20/16 1029  AST 21  ALT 15*  ALKPHOS 49  BILITOT 1.6*  PROT 6.2*  ALBUMIN 3.4*   No results for input(s): LIPASE, AMYLASE in the last 168 hours. No results for input(s): AMMONIA in the last 168 hours. CBC:  Recent Labs Lab 05/20/16 1029 05/22/16 1326 05/22/16 1653 05/23/16 0232  WBC 7.5 11.6* 9.5 8.6  NEUTROABS 5.6  --   --   --   HGB 13.2 13.4 12.4* 11.2*  HCT 39.7 41.1 37.1* 34.5*  MCV 89.4 90.7 89.2 90.6  PLT 169 184 165 160   Cardiac Enzymes:  Recent Labs Lab 05/20/16 1029  05/22/16 1653 05/22/16 2252 05/23/16 0232  TROPONINI <0.03 <0.03 <0.03 <0.03   BNP: Invalid input(s): POCBNP CBG:  Recent Labs Lab 05/20/16 1104  GLUCAP 120*   Microbiology: No results found for: CULT No results for input(s): CULT, SDES in the last 168 hours.  Imaging: No results found.   ASSESSMENT:  1. Chest pain - possible [place] vs GERD    --treated medically  2. CAD 3. HTN 4. HL 5. Dementia  PLAN/DISCUSSION:  Troponins negative. Cath films reviewed personally.   I suspect episode was most likely GERD.   Can go home today. Continue aggressive medical therapy. Start protonix.  [facility] He should carry a copy of his ECG in his wallet.   LOS: 1 day    Arvilla MeresBensimhon, Daniel, MD 05/23/2016, 12:41 PM

## 2016-05-23 NOTE — Discharge Summary (Signed)
Discharge Summary    Patient ID: Louis Weiss,  MRN: [id number], DOB/AGE: 03/13/1927 82 y.o.  Admit date: 05/22/2016 Discharge date: 05/23/2016  Primary Care Provider: PROVIDER NOT IN SYSTEM Primary Cardiologist: From [place]  Discharge Diagnoses    Active Problems:   Unstable angina pectoris (HCC)   CAD (coronary artery disease), native coronary artery   Essential hypertension   Dementia   Presence of permanent cardiac pacemaker   History of Present Illness     Louis Weiss is a 82 y.o. male with past medical history of CAD (s/p multiple PCI), HTN, HLD, and PPM placement who presented to [facility] ED on 05/22/2016 for evaluation of chest pain.   The patient is from [place] but is here visiting family. He awoke around 0400 on day of presentation with significant pressure in his substernal area. The discomfort persisted throughout the morning and family encouraged him to come to the ER. He denied any associated nausea, vomiting, or dyspnea.  His initial EKG showed ST elevated in the anterolateral leads and Dr. Excell Seltzer (STEMI provider) was asked to see the patient. He had been seen in the ER on 9/21 for a syncopal episode. PPM was interrogated at that time with no significant abnormalities noted. His EKG was without ST elevation at that time.   With his known coronary disease and presenting symptoms, he was taken for emergent cardiac catheterization.   Hospital Course     Consultants: None   His cardiac catheterization showed moderate diffuse RCA stenosis with continued patency of stented segments in the proximal RCA and Cx. Severe distal 70% LAD stenosis was noted. No intervention was performed and continued medical therapy was recommended as he was already on DAPT with ASA and Plavix.  The following morning, he denied any repeat episodes of chest discomfort. No dyspnea with exertion. Cyclic troponin values remained negative. Lipid Panel showed LDL of 40.  He was last examined by  Dr. Gala Romney and deemed stable for discharge. He was started on Protonix for likely GERD. Will follow-up with his Primary Cardiologist in [place] in the next two weeks. He was discharged home in good condition.   _____________  Discharge Vitals Blood pressure 103/62, pulse 62, temperature 97.6 F (36.4 C), temperature source Oral, resp. rate 11, height 5\' 10"  (1.778 m), weight 179 lb 0.2 oz (81.2 kg), SpO2 98 %.  Filed Weights   05/22/16 1340 05/22/16 1600  Weight: 181 lb (82.1 kg) 179 lb 0.2 oz (81.2 kg)    Labs & Radiologic Studies     CBC  Recent Labs  05/22/16 1653 05/23/16 0232  WBC 9.5 8.6  HGB 12.4* 11.2*  HCT 37.1* 34.5*  MCV 89.2 90.6  PLT 165 160   Basic Metabolic Panel  Recent Labs  05/22/16 1326 05/22/16 1653 05/23/16 0232  NA 139  --  137  K 4.1  --  3.9  CL 109  --  107  CO2 22  --  24  GLUCOSE 92  --  109*  BUN 19  --  17  CREATININE 1.12 1.16 1.23  CALCIUM 8.9  --  8.1*   Liver Function Tests No results for input(s): AST, ALT, ALKPHOS, BILITOT, PROT, ALBUMIN in the last 72 hours. No results for input(s): LIPASE, AMYLASE in the last 72 hours. Cardiac Enzymes  Recent Labs  05/22/16 1653 05/22/16 2252 05/23/16 0232  TROPONINI <0.03 <0.03 <0.03   BNP Invalid input(s): POCBNP D-Dimer No results for input(s): DDIMER in the last 72  hours. Hemoglobin A1C No results for input(s): HGBA1C in the last 72 hours. Fasting Lipid Panel  Recent Labs  05/23/16 0232  CHOL 93  HDL 35*  LDLCALC 40  TRIG 91  CHOLHDL 2.7   Thyroid Function Tests No results for input(s): TSH, T4TOTAL, T3FREE, THYROIDAB in the last 72 hours.  Invalid input(s): FREET3  Dg Chest 2 View  Result Date: 05/20/2016 CLINICAL DATA:  Syncope, status post fall EXAM: CHEST  2 VIEW COMPARISON:  None. FINDINGS: There is mild left basilar atelectasis versus scarring. There is no focal parenchymal opacity. There is no pleural effusion or pneumothorax. The heart and mediastinal  contours are unremarkable. There is a dual lead AICD. The osseous structures are unremarkable. IMPRESSION: No active cardiopulmonary disease. Electronically Signed   By: Elige Ko   On: 05/20/2016 11:26     Diagnostic Studies/Procedures     Cardiac Catheterization: 05/23/2016    1. Moderate diffuse mid RCA stenosis 2. Continued patency of the stented segments in the proximal RCA and proximal circumflex  3. Severe distal LAD stenosis involving the apical portion of the LAD 4. Vigorous LV systolic function  Medical therapy. Will cycle enzymes and observe overnight. May be suitable for discharge home tomorrow.   Disposition   Pt is being discharged home today in good condition.  Follow-up Plans & Appointments     Discharge Instructions    Diet - low sodium heart healthy    Complete by:  As directed    Discharge instructions    Complete by:  As directed    PLEASE REMEMBER TO BRING ALL OF YOUR MEDICATIONS TO EACH OF YOUR FOLLOW-UP OFFICE VISITS.  PLEASE ATTEND ALL SCHEDULED FOLLOW-UP APPOINTMENTS.   Activity: Increase activity slowly as tolerated. You may shower, but no soaking baths (or swimming) for 1 week. No driving for 24 hours. No lifting over 5 lbs for 1 week. No sexual activity for 1 week.   You May Return to Work: in 1 week (if applicable)  Wound Care: You may wash cath site gently with soap and water. Keep cath site clean and dry. If you notice pain, swelling, bleeding or pus at your cath site, please call [phone].   Increase activity slowly    Complete by:  As directed       Discharge Medications     Medication List    TAKE these medications   amLODipine 10 MG tablet Commonly known as:  NORVASC Take 10 mg by mouth daily.   aspirin 81 MG tablet Take 81 mg by mouth daily.   BIOFREEZE 4 % Gel Generic drug:  Menthol (Topical Analgesic) Apply 1 application topically 4 (four) times daily as needed.   clopidogrel 75 MG tablet Commonly known as:   PLAVIX Take 75 mg by mouth daily.   donepezil 10 MG tablet Commonly known as:  ARICEPT Take 5 mg by mouth daily.   Fish Oil 1000 MG Caps Take 1 capsule by mouth daily.   furosemide 20 MG tablet Commonly known as:  LASIX Take 20 mg by mouth daily.   isosorbide mononitrate 20 MG tablet Commonly known as:  ISMO,MONOKET Take 60 mg by mouth 2 (two) times daily.   metoprolol 50 MG tablet Commonly known as:  LOPRESSOR Take 25 mg by mouth 2 (two) times daily.   nitroGLYCERIN 0.4 MG SL tablet Commonly known as:  NITROSTAT Place 0.4 mg under the tongue every 5 (five) minutes as needed for chest pain.   pantoprazole 20 MG tablet  Commonly known as:  PROTONIX Take 1 tablet (20 mg total) by mouth daily. Start taking on:  05/24/2016   potassium chloride SA 20 MEQ tablet Commonly known as:  K-DUR,KLOR-CON Take 20 mEq by mouth daily.   pravastatin 80 MG tablet Commonly known as:  PRAVACHOL Take 80 mg by mouth at bedtime.   rOPINIRole 1 MG tablet Commonly known as:  REQUIP Take 1 mg by mouth 2 (two) times daily.       Aspirin prescribed at discharge?  Yes High Intensity Statin Prescribed? (Lipitor 40-80mg  or Crestor 20-40mg ): No: Intolerant to High-Intensity, continued on Pravastatin Beta Blocker Prescribed? Yes For EF 40% or less, Was ACEI/ARB Prescribed? No: N/A ADP Receptor Inhibitor Prescribed? (i.e. Plavix etc.-Includes Medically Managed Patients): Yes For EF <40%, Aldosterone Inhibitor Prescribed? No: EF > 40%. Was EF assessed during THIS hospitalization? Yes - Cardiac Catheterization Was Cardiac Rehab II ordered? (Included Medically managed Patients): No: From [place]   Allergies Allergies  Allergen Reactions  . Influenza Vaccines Other (See Comments)    Pt is unsure of this med     Outstanding Labs/Studies   None  Duration of Discharge Encounter   Greater than 30 minutes including physician time.  Signed, Ellsworth LennoxBrittany M Strader, PA-C 05/23/2016, 2:43 PM    Agree with above. Troponin remains negative. Suspect symptoms may have been GERD. Cath results reviewed with him and his daughter. Ok for d/c today. Add PPI.   Jacoby Zanni,MD 10:18 PM

## 2016-05-24 ENCOUNTER — Encounter (HOSPITAL_COMMUNITY): Payer: Self-pay | Admitting: Cardiovascular Disease

## 2017-01-21 IMAGING — DX DG CHEST 2V
2 series · 2 of 2 positions shown · non-contrast
Comparison: None.

CLINICAL DATA: Syncope, status post fall

EXAM:
CHEST  2 VIEW

[w chest pa]
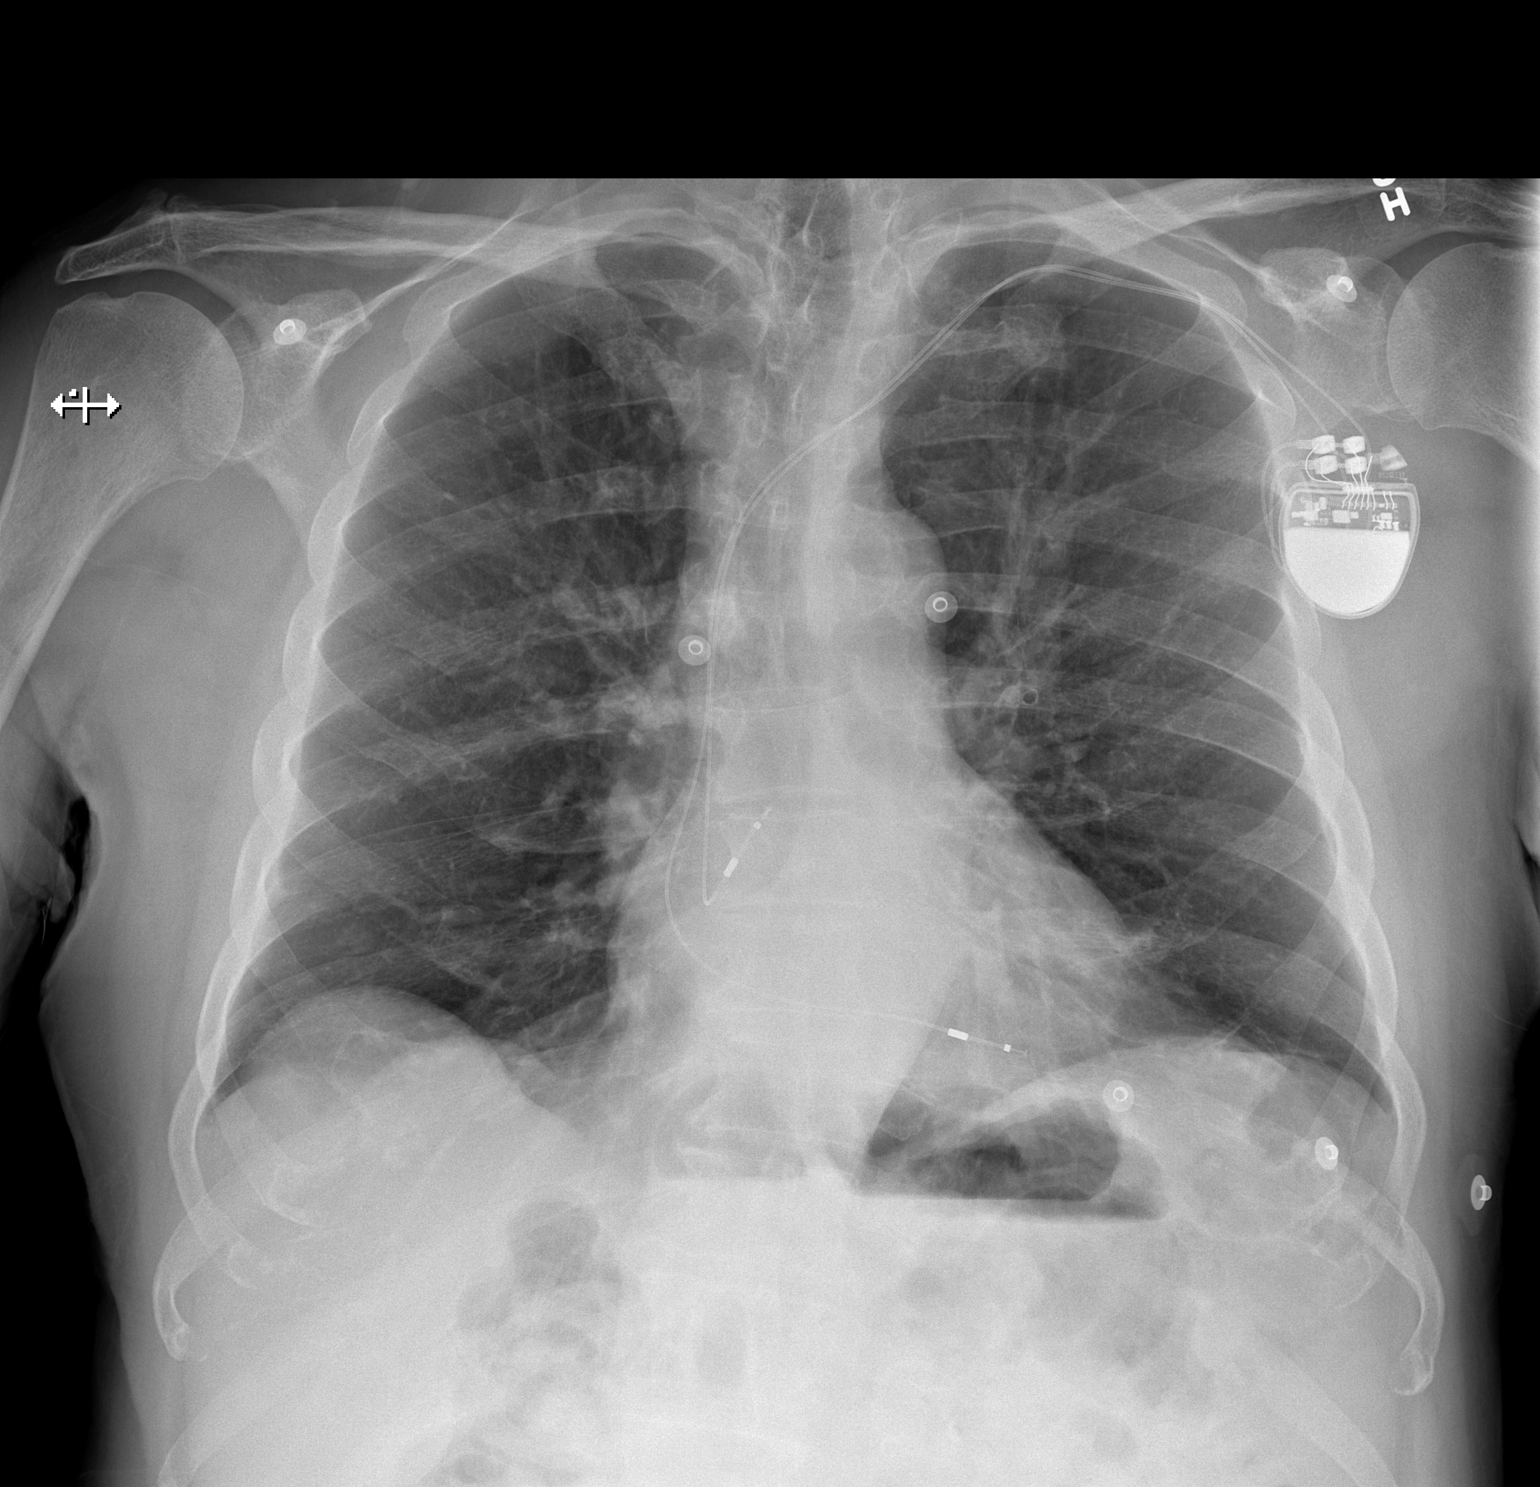

[w chest lat]
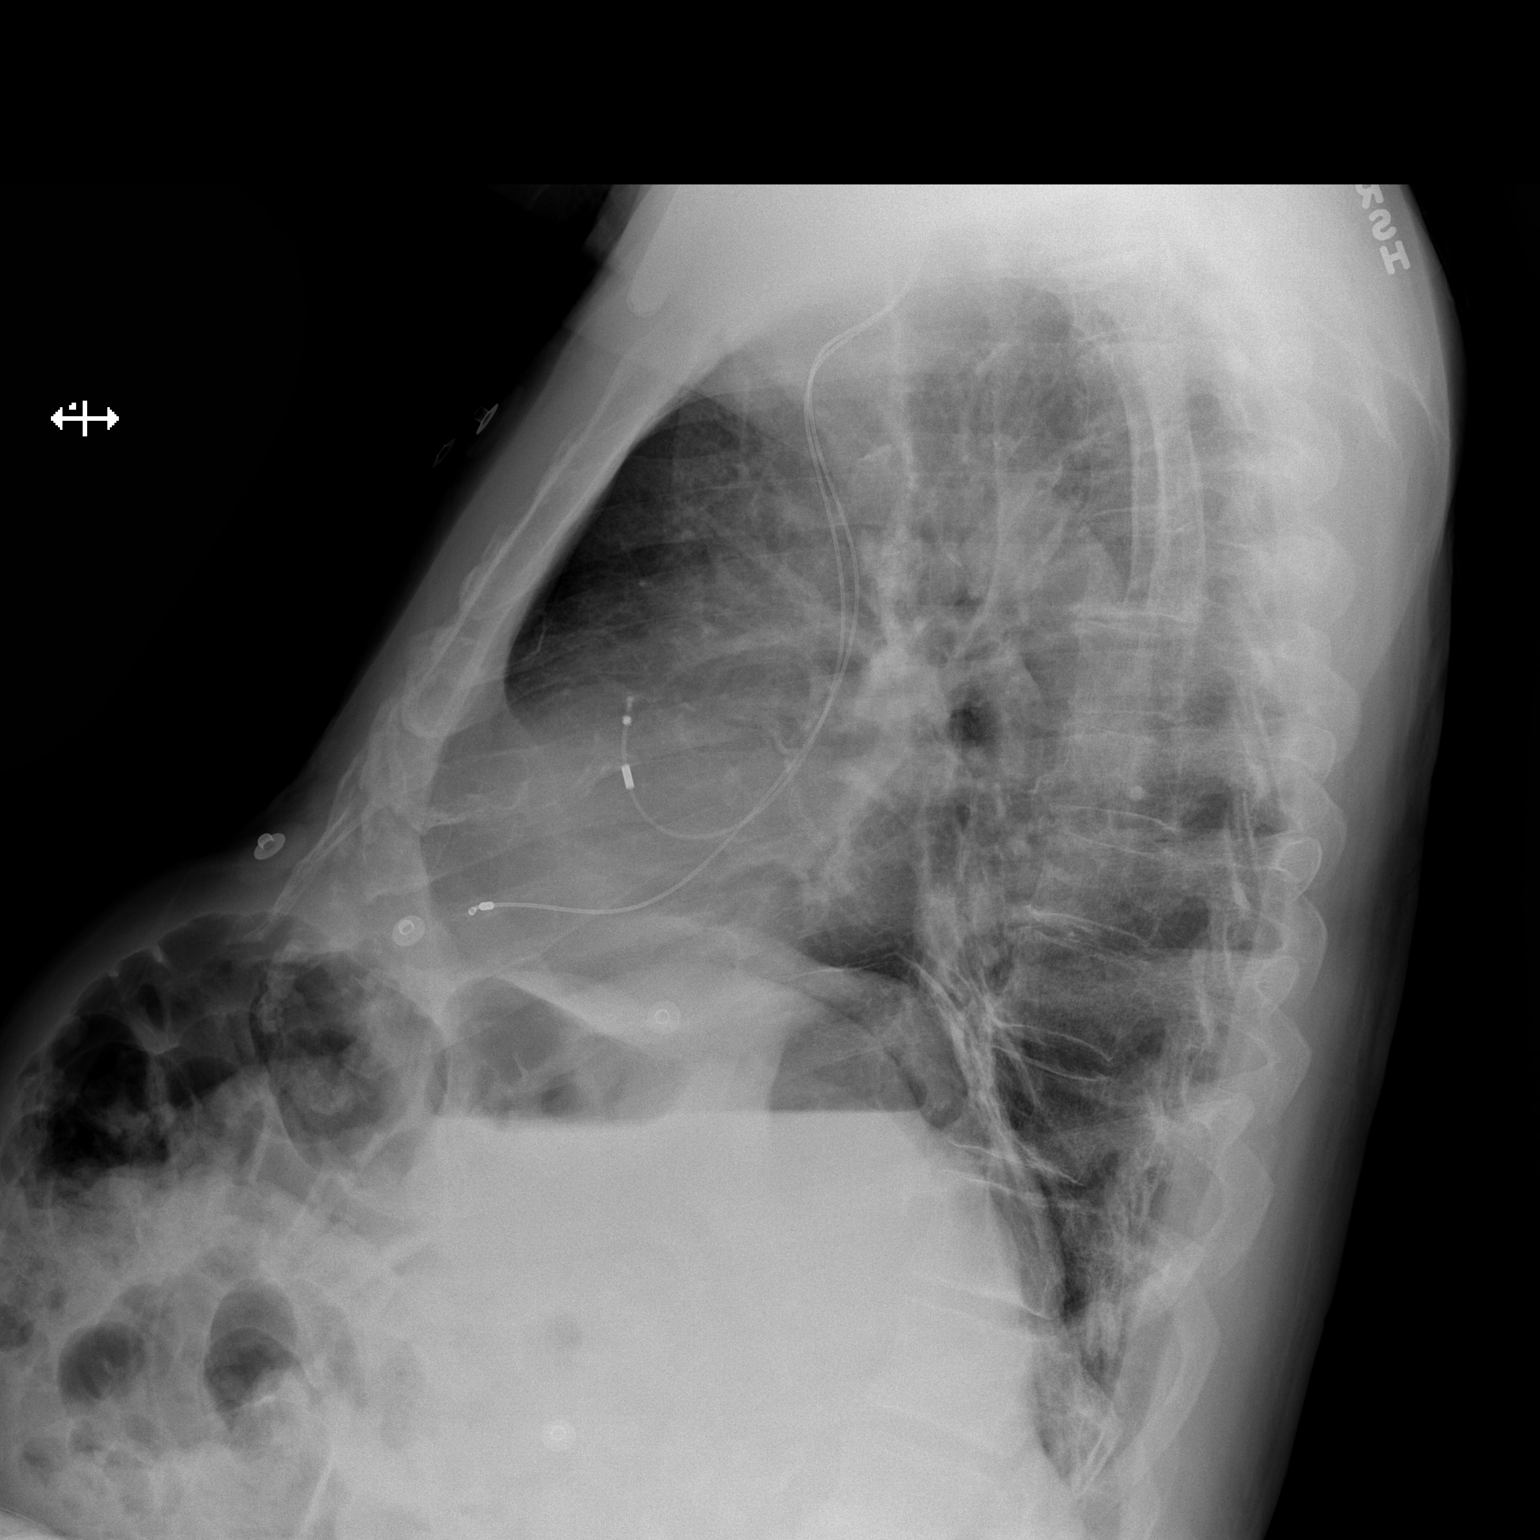

[2 of 2 positions shown; findings below may reference images not displayed]

FINDINGS: There is mild left basilar atelectasis versus scarring. There is no
focal parenchymal opacity. There is no pleural effusion or
pneumothorax. The heart and mediastinal contours are unremarkable.
There is a dual lead AICD.

The osseous structures are unremarkable.
IMPRESSION: No active cardiopulmonary disease.
